# Patient Record
Sex: Male | Born: 1970 | Race: White | Hispanic: No | Marital: Married
Health system: Southern US, Community
[De-identification: ages and names within clinical notes are randomized; demographics above are authoritative.]

---

## 2013-12-26 ENCOUNTER — Other Ambulatory Visit: Payer: Self-pay | Admitting: General Practice

## 2013-12-26 DIAGNOSIS — N50819 Testicular pain, unspecified: Secondary | ICD-10-CM

## 2013-12-30 ENCOUNTER — Ambulatory Visit
Admission: RE | Admit: 2013-12-30 | Discharge: 2013-12-30 | Disposition: A | Discharge status: Home / Self Care | Payer: BC Managed Care – PPO | Source: Ambulatory Visit | Attending: General Practice | Admitting: General Practice

## 2013-12-30 ENCOUNTER — Encounter (INDEPENDENT_AMBULATORY_CARE_PROVIDER_SITE_OTHER): Payer: Self-pay

## 2013-12-30 DIAGNOSIS — N50819 Testicular pain, unspecified: Secondary | ICD-10-CM

## 2016-02-22 IMAGING — US US SCROTUM
1 series · 14 of 25 positions shown · non-contrast
Comparison: None.

CLINICAL DATA: History of varicocele by physical exam. Bilateral
testicular pain.

EXAM:
SCROTAL ULTRASOUND
DOPPLER ULTRASOUND OF THE TESTICLES
TECHNIQUE: Complete ultrasound examination of the testicles, epididymis, and
other scrotal structures was performed. Color and spectral Doppler
ultrasound were also utilized to evaluate blood flow to the
testicles.

[Series 1: us scrotum · 14 of 41 slices shown]
[im 1/41]
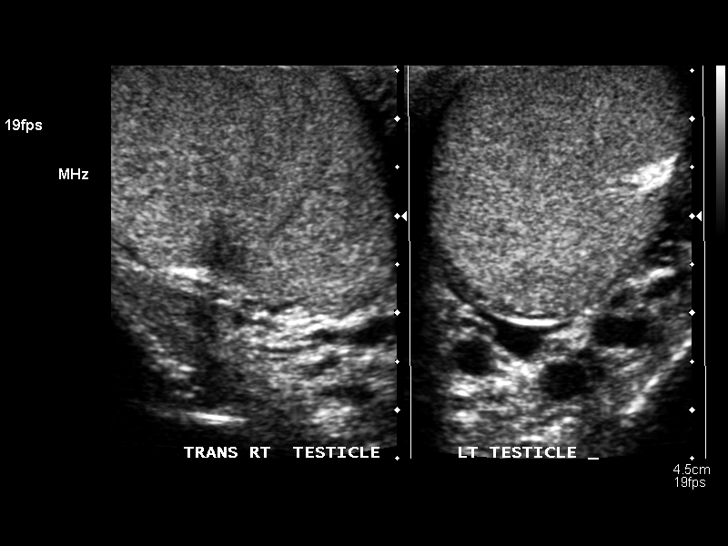
[im 4/41]
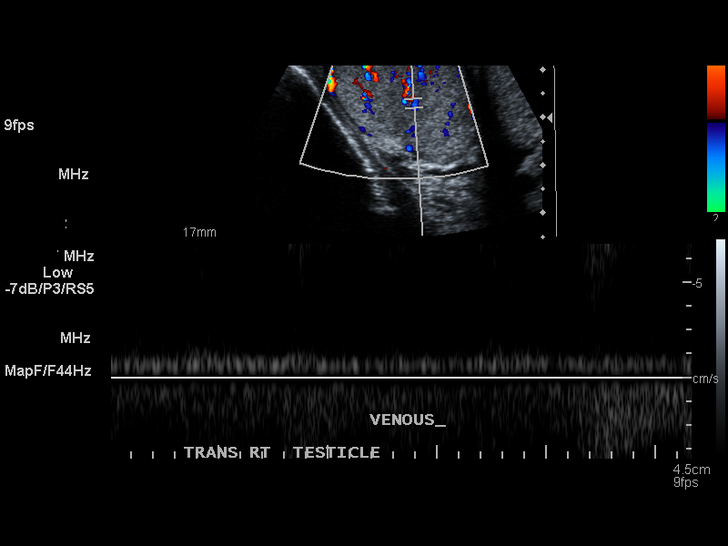
[im 7/41]
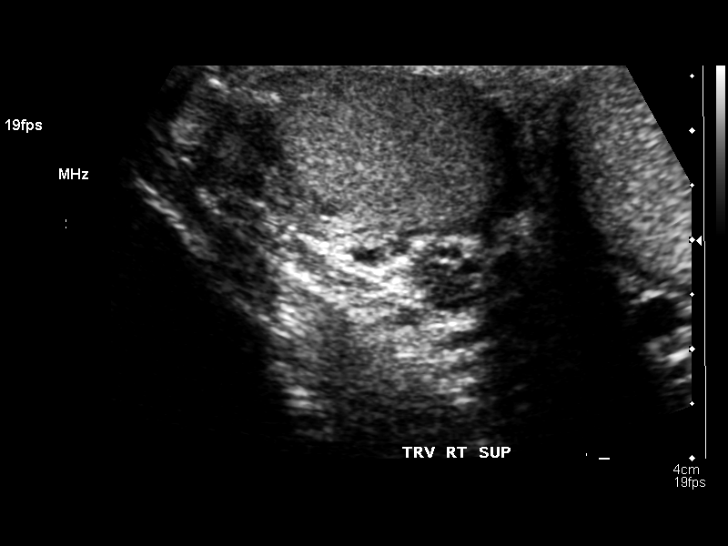
[im 11/41]
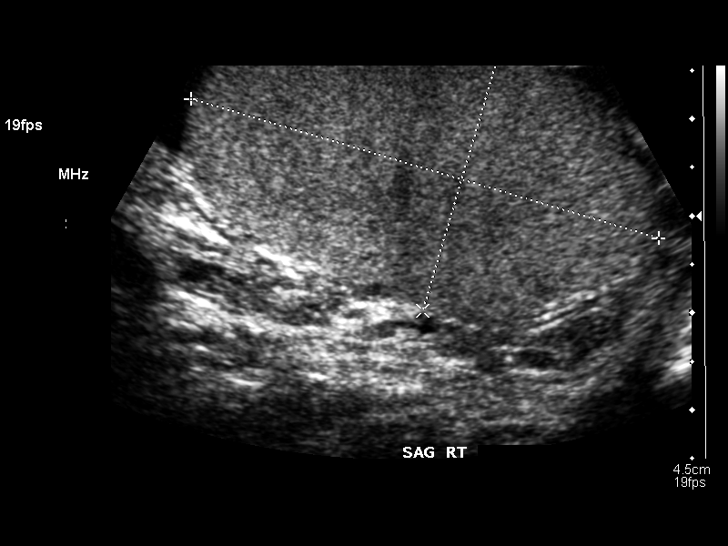
[im 14/41]
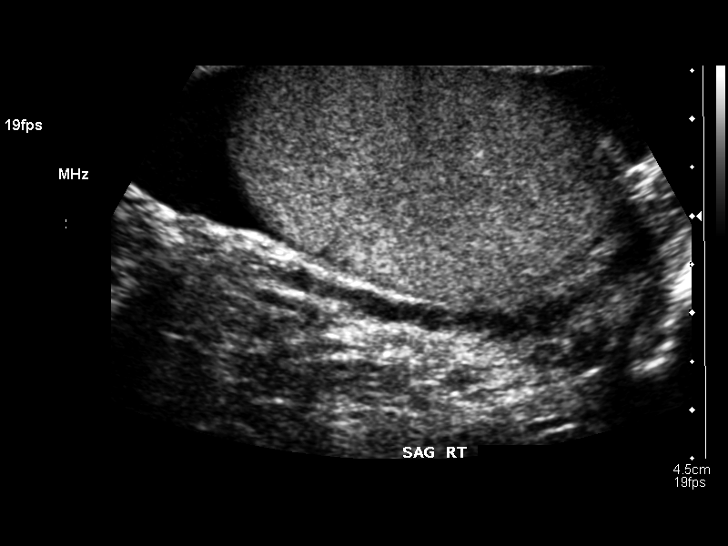
[im 16/41]
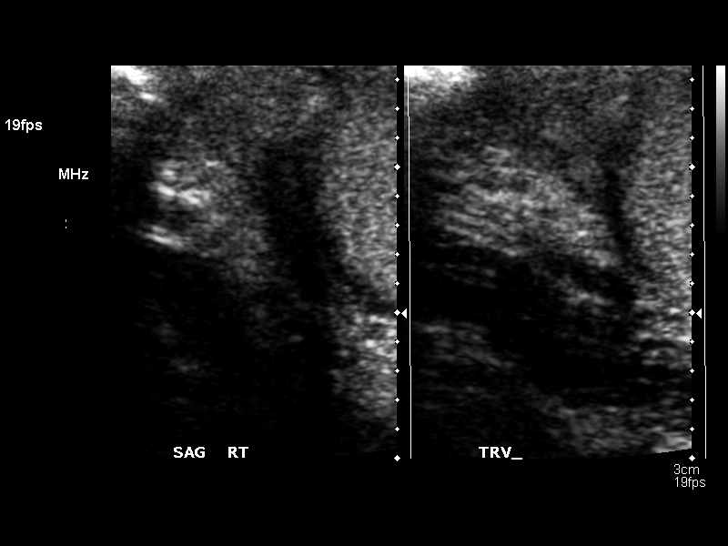
[im 19/41]
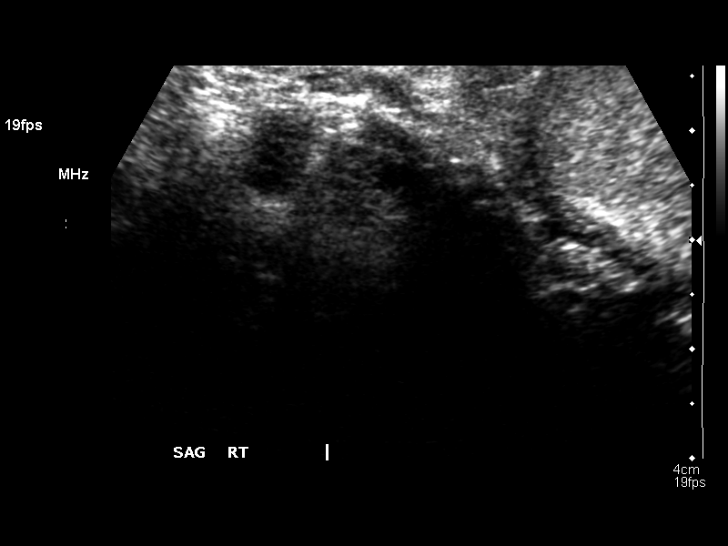
[im 22/41]
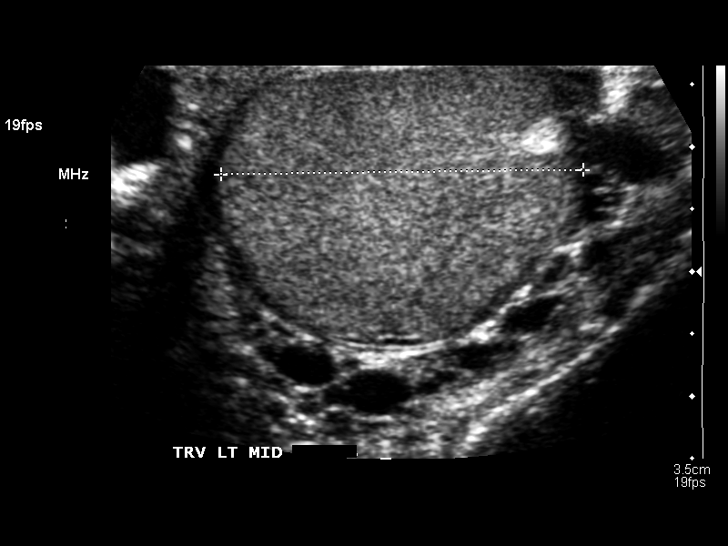
[im 26/41]
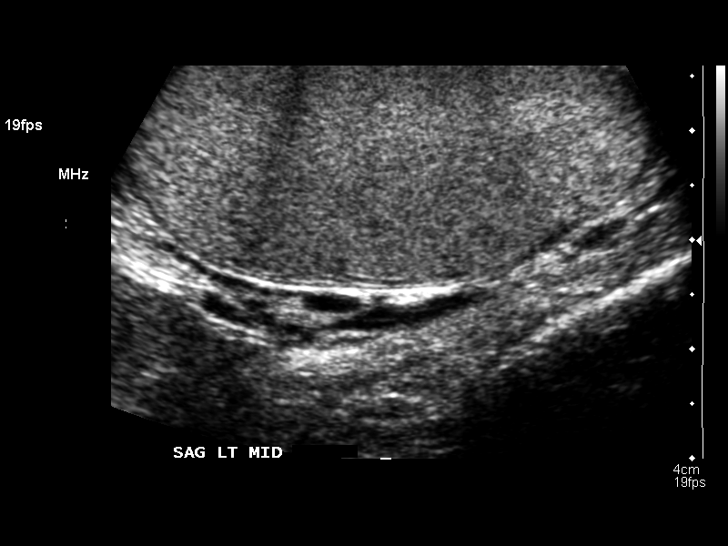
[im 27/41]
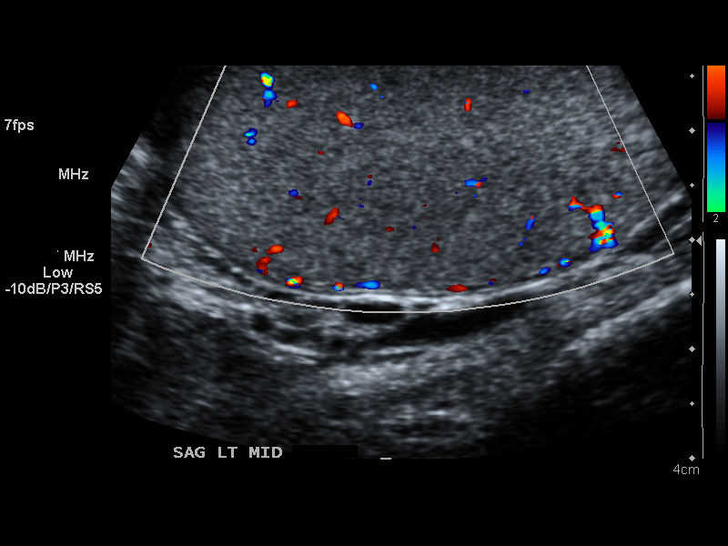
[im 31/41]
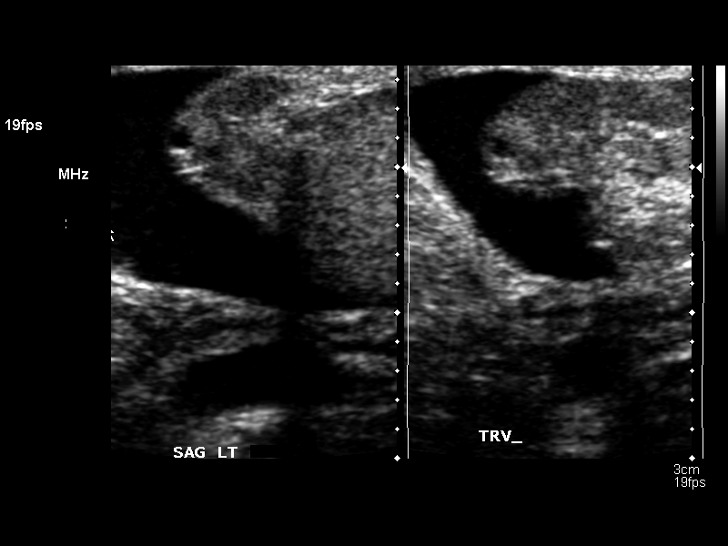
[im 34/41]
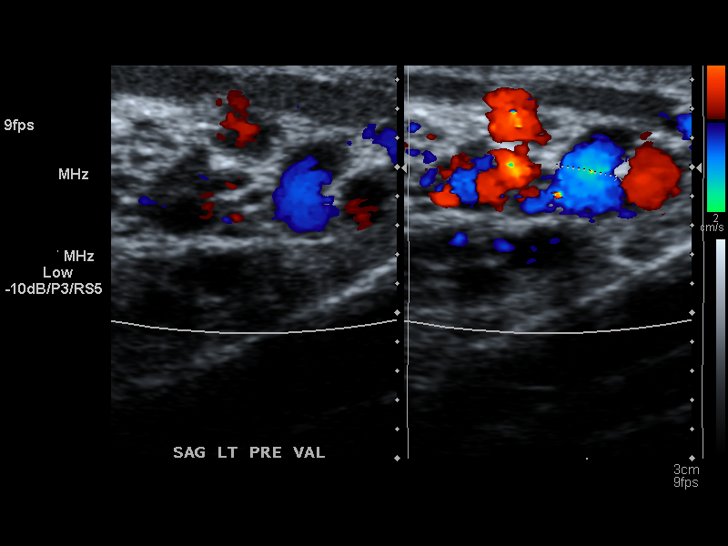
[im 37/41]
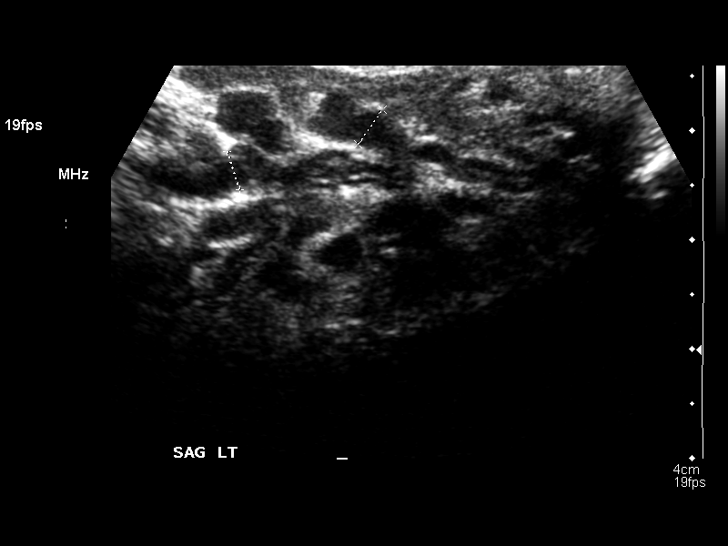
[im 41/41]
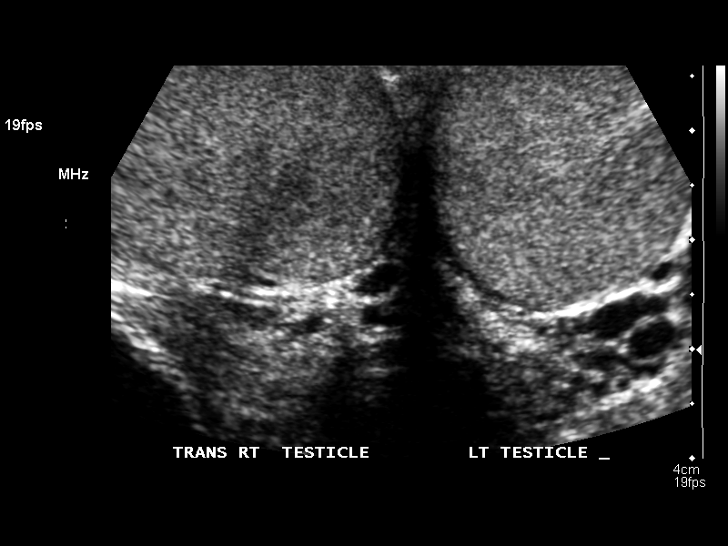

[14 of 25 positions shown; findings below may reference images not displayed]

FINDINGS: Right testicle

Measurements: 5.0 x 2.7 x 2.8 cm. No mass or microlithiasis
visualized.

Left testicle

Measurements: 4.8 x 2.2 x 2.9 cm. No mass or microlithiasis
visualized.

Right epididymis:  Normal in size and appearance.

Left epididymis:  Normal in size and appearance.

Hydrocele:  None visualized.

Varicocele:  Possible small left varicocele.

Pulsed Doppler interrogation of both testes demonstrates low
resistance arterial and venous waveforms bilaterally.
IMPRESSION: 1. No acute findings.
2. Possible small left varicocele.
